# Patient Record
Sex: Female | Born: 2005 | Hispanic: Yes | Marital: Single | Smoking: Never smoker
Health system: Southern US, Community
[De-identification: ages and names within clinical notes are randomized; demographics above are authoritative.]

## PROBLEM LIST (undated history)

## (undated) DIAGNOSIS — E301 Precocious puberty: Secondary | ICD-10-CM

---
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
## 2024-05-03 ENCOUNTER — Encounter (HOSPITAL_COMMUNITY)
Admission: EM | Disposition: A | Discharge status: Home / Self Care | Payer: Self-pay | Source: Home / Self Care | Attending: Emergency Medicine

## 2024-05-03 ENCOUNTER — Other Ambulatory Visit (HOSPITAL_COMMUNITY): Payer: Self-pay

## 2024-05-03 ENCOUNTER — Emergency Department (HOSPITAL_COMMUNITY): Admitting: Anesthesiology

## 2024-05-03 ENCOUNTER — Encounter (HOSPITAL_COMMUNITY): Payer: Self-pay

## 2024-05-03 ENCOUNTER — Other Ambulatory Visit: Payer: Self-pay

## 2024-05-03 ENCOUNTER — Observation Stay (HOSPITAL_COMMUNITY)
Admission: EM | Admit: 2024-05-03 | Discharge: 2024-05-04 | Disposition: A | Discharge status: Home / Self Care | Attending: Surgery | Admitting: Surgery

## 2024-05-03 ENCOUNTER — Emergency Department (HOSPITAL_COMMUNITY)

## 2024-05-03 DIAGNOSIS — R1030 Lower abdominal pain, unspecified: Secondary | ICD-10-CM | POA: Diagnosis present

## 2024-05-03 DIAGNOSIS — K8012 Calculus of gallbladder with acute and chronic cholecystitis without obstruction: Principal | ICD-10-CM | POA: Insufficient documentation

## 2024-05-03 DIAGNOSIS — K810 Acute cholecystitis: Principal | ICD-10-CM | POA: Diagnosis present

## 2024-05-03 LAB — URINALYSIS, MICROSCOPIC (REFLEX)

## 2024-05-03 LAB — URINALYSIS, ROUTINE W REFLEX MICROSCOPIC
Bilirubin Urine: NEGATIVE
Glucose, UA: NEGATIVE mg/dL
Ketones, ur: NEGATIVE mg/dL
Leukocytes,Ua: NEGATIVE
Nitrite: NEGATIVE
Protein, ur: NEGATIVE mg/dL
Specific Gravity, Urine: 1.015 (ref 1.005–1.030)
pH: 5.5 (ref 5.0–8.0)

## 2024-05-03 LAB — CBC
HCT: 42.6 % (ref 36.0–46.0)
Hemoglobin: 14.1 g/dL (ref 12.0–15.0)
MCH: 29.3 pg (ref 26.0–34.0)
MCHC: 33.1 g/dL (ref 30.0–36.0)
MCV: 88.6 fL (ref 80.0–100.0)
Platelets: 298 10*3/uL (ref 150–400)
RBC: 4.81 MIL/uL (ref 3.87–5.11)
RDW: 12.6 % (ref 11.5–15.5)
WBC: 6.3 10*3/uL (ref 4.0–10.5)
nRBC: 0 % (ref 0.0–0.2)

## 2024-05-03 LAB — COMPREHENSIVE METABOLIC PANEL WITH GFR
ALT: 14 U/L (ref 0–44)
AST: 19 U/L (ref 15–41)
Albumin: 4.3 g/dL (ref 3.5–5.0)
Alkaline Phosphatase: 57 U/L (ref 38–126)
Anion gap: 7 (ref 5–15)
BUN: 7 mg/dL (ref 6–20)
CO2: 25 mmol/L (ref 22–32)
Calcium: 9.1 mg/dL (ref 8.9–10.3)
Chloride: 106 mmol/L (ref 98–111)
Creatinine, Ser: 0.7 mg/dL (ref 0.44–1.00)
GFR, Estimated: >60 mL/min (ref >60)
Glucose, Bld: 115 mg/dL — ABNORMAL HIGH (ref 70–99)
Potassium: 4.2 mmol/L (ref 3.5–5.1)
Sodium: 138 mmol/L (ref 135–145)
Total Bilirubin: 0.7 mg/dL (ref 0.0–1.2)
Total Protein: 7.4 g/dL (ref 6.5–8.1)

## 2024-05-03 LAB — LIPASE, BLOOD: Lipase: 39 U/L (ref 11–51)

## 2024-05-03 LAB — HCG, SERUM, QUALITATIVE: Preg, Serum: NEGATIVE

## 2024-05-03 SURGERY — LAPAROSCOPIC CHOLECYSTECTOMY
Anesthesia: General

## 2024-05-03 MED ORDER — HYDROMORPHONE HCL 1 MG/ML IJ SOLN: 0.5000 mg | INTRAMUSCULAR | Status: DC | PRN

## 2024-05-03 MED ORDER — IOHEXOL 350 MG/ML SOLN
60.0000 mL | Freq: Once | INTRAVENOUS | Status: AC | PRN
  Administered 2024-05-03: 60 mL via INTRAVENOUS

## 2024-05-03 MED ORDER — ACETAMINOPHEN 325 MG PO TABS
650.0000 mg | ORAL_TABLET | Freq: Four times a day (QID) | ORAL | Status: DC | PRN
Start: 2024-05-03 — End: 2024-05-04

## 2024-05-03 MED ORDER — LACTATED RINGERS IV SOLN: INTRAVENOUS | Status: DC

## 2024-05-03 MED ORDER — CHLORHEXIDINE GLUCONATE 0.12 % MT SOLN
15.0000 mL | Freq: Once | OROMUCOSAL | Status: AC
  Administered 2024-05-03: 15 mL via OROMUCOSAL

## 2024-05-03 MED ORDER — ENOXAPARIN SODIUM 40 MG/0.4ML IJ SOSY: 40.0000 mg | PREFILLED_SYRINGE | INTRAMUSCULAR | Status: DC

## 2024-05-03 MED ORDER — OXYCODONE HCL 5 MG PO TABS: 5.0000 mg | ORAL_TABLET | ORAL | Status: DC | PRN

## 2024-05-03 MED ORDER — SODIUM CHLORIDE 0.9 % IV SOLN
2.0000 g | INTRAVENOUS | Status: DC
  Administered 2024-05-04: 2 g via INTRAVENOUS
  Filled 2024-05-03: qty 20

## 2024-05-03 MED ORDER — ACETAMINOPHEN 650 MG RE SUPP: 650.0000 mg | Freq: Four times a day (QID) | RECTAL | Status: DC | PRN

## 2024-05-03 MED ORDER — SIMETHICONE 80 MG PO CHEW: 40.0000 mg | CHEWABLE_TABLET | Freq: Four times a day (QID) | ORAL | Status: DC | PRN

## 2024-05-03 MED ORDER — INDOCYANINE GREEN 25 MG IV SOLR
1.2500 mg | Freq: Once | INTRAVENOUS | Status: AC
  Administered 2024-05-03: 1.25 mg via INTRAVENOUS
  Filled 2024-05-03: qty 10

## 2024-05-03 MED ORDER — CEFTRIAXONE SODIUM 1 G IJ SOLR
1.0000 g | Freq: Once | INTRAMUSCULAR | Status: AC
  Administered 2024-05-03: 1 g via INTRAVENOUS
  Filled 2024-05-03: qty 10

## 2024-05-03 MED ORDER — SCOPOLAMINE 1 MG/3DAYS TD PT72
1.0000 | MEDICATED_PATCH | TRANSDERMAL | Status: DC
  Administered 2024-05-03: 1.5 mg via TRANSDERMAL

## 2024-05-03 MED ORDER — ONDANSETRON 4 MG PO TBDP: 4.0000 mg | ORAL_TABLET | Freq: Four times a day (QID) | ORAL | Status: DC | PRN

## 2024-05-03 MED ORDER — ORAL CARE MOUTH RINSE: 15.0000 mL | Freq: Once | OROMUCOSAL | Status: AC

## 2024-05-03 MED ORDER — DIPHENHYDRAMINE HCL 25 MG PO CAPS: 25.0000 mg | ORAL_CAPSULE | Freq: Four times a day (QID) | ORAL | Status: DC | PRN

## 2024-05-03 MED ORDER — ONDANSETRON 4 MG PO TBDP
4.0000 mg | ORAL_TABLET | Freq: Once | ORAL | Status: AC | PRN
  Administered 2024-05-03: 4 mg via ORAL
  Filled 2024-05-03: qty 1

## 2024-05-03 MED ORDER — DIPHENHYDRAMINE HCL 50 MG/ML IJ SOLN: 25.0000 mg | Freq: Four times a day (QID) | INTRAMUSCULAR | Status: DC | PRN

## 2024-05-03 MED ORDER — METOPROLOL TARTRATE 5 MG/5ML IV SOLN: 5.0000 mg | Freq: Four times a day (QID) | INTRAVENOUS | Status: DC | PRN

## 2024-05-03 MED ORDER — LACTATED RINGERS IV SOLN
INTRAVENOUS | Status: AC
Start: 2024-05-03 — End: 2024-05-04

## 2024-05-03 MED ORDER — HYDROMORPHONE HCL 1 MG/ML IJ SOLN
0.5000 mg | INTRAMUSCULAR | Status: DC | PRN
  Administered 2024-05-03: 0.5 mg via INTRAVENOUS
  Filled 2024-05-03: qty 0.5

## 2024-05-03 MED ORDER — SCOPOLAMINE 1 MG/3DAYS TD PT72
MEDICATED_PATCH | TRANSDERMAL | Status: AC
  Filled 2024-05-03: qty 1

## 2024-05-03 NOTE — ED Provider Notes (Signed)
  EMERGENCY DEPARTMENT AT [facility] Provider Note   CSN: [id number] Arrival date & time: 05/03/24  0459     History  Chief Complaint  Patient presents with   Abdominal Pain    Maureen Wolf is a 18 y.o. female.  Patient presents to the emergency department for evaluation of abdominal pain.  Patient reports that she has frequent intermittent abdominal pain secondary to chronic constipation.  She started having pain around 2 AM.  Pain is in the center of the abdomen.  Patient reports a large bowel movement at 3 AM but did not have resolution of her pain.       Home Medications Prior to Admission medications   Not on File      Allergies    Patient has no known allergies.    Review of Systems   Review of Systems  Physical Exam Updated Vital Signs BP 133/82 (BP Location: Right Arm)   Pulse 81   Temp 98.3 F (36.8 C)   Resp 18   Ht 5\' 3"  (1.6 m)   Wt 62.1 kg   LMP 05/03/2024   SpO2 100%   BMI 24.27 kg/m  Physical Exam Vitals and nursing note reviewed.  Constitutional:      General: She is not in acute distress.    Appearance: She is well-developed.  HENT:     Head: Normocephalic and atraumatic.     Mouth/Throat:     Mouth: Mucous membranes are moist.  Eyes:     General: Vision grossly intact. Gaze aligned appropriately.     Extraocular Movements: Extraocular movements intact.     Conjunctiva/sclera: Conjunctivae normal.  Cardiovascular:     Rate and Rhythm: Normal rate and regular rhythm.     Pulses: Normal pulses.     Heart sounds: Normal heart sounds, S1 normal and S2 normal. No murmur heard.    No friction rub. No gallop.  Pulmonary:     Effort: Pulmonary effort is normal. No respiratory distress.     Breath sounds: Normal breath sounds.  Abdominal:     General: Bowel sounds are normal.     Palpations: Abdomen is soft.     Tenderness: There is abdominal tenderness in the periumbilical area and suprapubic area. There is  no guarding or rebound.     Hernia: No hernia is present.  Musculoskeletal:        General: No swelling.     Cervical back: Full passive range of motion without pain, normal range of motion and neck supple. No spinous process tenderness or muscular tenderness. Normal range of motion.     Right lower leg: No edema.     Left lower leg: No edema.  Skin:    General: Skin is warm and dry.     Capillary Refill: Capillary refill takes less than 2 seconds.     Findings: No ecchymosis, erythema, rash or wound.  Neurological:     General: No focal deficit present.     Mental Status: She is alert and oriented to person, place, and time.     GCS: GCS eye subscore is 4. GCS verbal subscore is 5. GCS motor subscore is 6.     Cranial Nerves: Cranial nerves 2-12 are intact.     Sensory: Sensation is intact.     Motor: Motor function is intact.     Coordination: Coordination is intact.  Psychiatric:        Attention and Perception: Attention normal.  Mood and Affect: Mood normal.        Speech: Speech normal.        Behavior: Behavior normal.     ED Results / Procedures / Treatments   Labs (all labs ordered are listed, but only abnormal results are displayed) Labs Reviewed  COMPREHENSIVE METABOLIC PANEL WITH GFR - Abnormal; Notable for the following components:      Result Value   Glucose, Bld 115 (*)    All other components within normal limits  LIPASE, BLOOD  CBC  HCG, SERUM, QUALITATIVE  URINALYSIS, ROUTINE W REFLEX MICROSCOPIC    EKG None  Radiology No results found.  Procedures Procedures    Medications Ordered in ED Medications  ondansetron  (ZOFRAN -ODT) disintegrating tablet 4 mg (4 mg Oral Given 05/03/24 0511)    ED Course/ Medical Decision Making/ A&P                                 Medical Decision Making Amount and/or Complexity of Data Reviewed Labs: ordered. Radiology: ordered.  Risk Prescription drug management.   Presents to the emergency  department for evaluation of lower abdominal pain.  Patient reports frequent pains similar to this associated with constipation.  Pain started at 2 AM but was not relieved by a bowel movement, as it normally is.  Examination reveals diffuse middle abdominal tenderness, no guarding or rebound.  Lab work unremarkable.  Will perform CT scan to further evaluate.  Will sign out to oncoming ER physician to follow CT results.        Final Clinical Impression(s) / ED Diagnoses Final diagnoses:  Lower abdominal pain    Rx / DC Orders ED Discharge Orders     None         Ballard Bongo, MD 05/03/24 [phone]

## 2024-05-03 NOTE — ED Provider Notes (Signed)
 Blood pressure 133/82, pulse 81, temperature 98.3 F (36.8 C), resp. rate 18, height 5' 3 (1.6 m), weight 62.1 kg, last menstrual period 05/03/2024, SpO2 100%.  Assuming care from Dr. Carol Chroman.  In short, Maureen Wolf is a 18 y.o. female with a chief complaint of Abdominal Pain .  Refer to the original H&P for additional details.  The current plan of care is to follow up on CT.  07:53 AM  CT with concern for acute cholecystitis. Spoke with radiology regarding scan. On my exam patient without significant tenderness in the RUQ. Labs reassuring. Will discuss with general surgery. Plan for admit.     Roberts Ching, MD 05/18/24 0000

## 2024-05-03 NOTE — ED Triage Notes (Signed)
 Pt POV d/t ABD pain onset 0200.  Pt knows she normally has pain from constipation but had a large BM at 0300.  Normally pain goes away but this time did not.  Pt did vomit a little but also normal with the constipation.

## 2024-05-03 NOTE — ED Notes (Signed)
 PA notified of patient's wishes to have gallbladder removed

## 2024-05-03 NOTE — H&P (Signed)
 Maureen Wolf 01-Oct-2006  [id number].    Chief Complaint/Reason for Consult: abdominal pain, cholecystitis  HPI:  This is a pleasant otherwise healthy 18 yo female who does vape everyday, but has been having some abdominal pain with eating in the past.  She states she was eating twice and day and has dropped to once a day as eating can make her stomach hurt from time to time.  She hasn't noticed any one specific type of food.  She has been awoken from sleep a couple times with pain that tends to go away.  Last night she awoke around 0200 with pain in her epigastrium.  She then developed nausea and one episode of vomiting.  She denies fevers or diarrhea.  She has had no further vomiting, but mild nausea.    She presented to the ED for evaluation.  Her pain seems to have subsided at this time.  Labs are unremarkable, but she has a CT scan concerning for cholecystitis as well as an US  with gb wall of 7.41mm and large gallstones.  We have been asked to see her for further evaluation and recommendations.  ROS: ROS: see HPI  History reviewed. No pertinent family history.  History reviewed. No pertinent past medical history.  Past Surgical History:  Procedure Laterality Date   SUPPRELIN  IMPLANT Left 08/03/2013   Procedure: SUPPRELIN  IMPLANT;  Surgeon: Melven Stable. Alanda Allegra, MD;  Location: [facility];  Service: Pediatrics;  Laterality: Left;   SUPPRELIN  IMPLANT Left 09/27/2014   Procedure: SUPPRELIN  REINSERT;  Surgeon: Melven Stable. Alanda Allegra, MD;  Location: [facility];  Service: Pediatrics;  Laterality: Left;   SUPPRELIN  REMOVAL Left 09/27/2014   Procedure: SUPPRELIN  REMOVAL;  Surgeon: Melven Stable. Alanda Allegra, MD;  Location: [facility];  Service: Pediatrics;  Laterality: Left;   SUPPRELIN  REMOVAL Left 07/30/2016   Procedure: EXCISION OF RETAINED SUPPRELIN  IMPLANT FROM LEFT UPPER ARM;  Surgeon: Alanda Allegra, MD;  Location: [facility];   Service: Pediatrics;  Laterality: Left;    Social History:  reports that she has never smoked. She has never used smokeless tobacco. She reports that she does not drink alcohol and does not use drugs.  Allergies: No Known Allergies  (Not in a hospital admission)    Physical Exam: Blood pressure 126/88, pulse (!) 55, temperature 99.1 F (37.3 C), temperature source Oral, resp. rate 16, height 5\' 3"  (1.6 m), weight 62.1 kg, last menstrual period 05/03/2024, SpO2 100%. General: pleasant, WD, WN female who is laying in bed in NAD HEENT: head is normocephalic, atraumatic.  Sclera are noninjected.  PERRL.  Ears and nose without any masses or lesions.  Mouth is pink and moist Heart: regular, rate, and rhythm.  Normal s1,s2. No obvious murmurs, gallops, or rubs noted.  Lungs: CTAB, no wheezes, rhonchi, or rales noted.  Respiratory effort nonlabored Abd: soft, NT, ND, +BS, no masses, hernias, or organomegaly MS: all 4 extremities are symmetrical with no cyanosis, clubbing, or edema. Psych: A&Ox3 with an appropriate affect.   Results for orders placed or performed during the hospital encounter of 05/03/24 (from the past 48 hours)  Lipase, blood     Status: None   Collection Time: 05/03/24  5:10 AM  Result Value Ref Range   Lipase 39 11 - 51 U/L    Comment: Performed at [facility], [address], [place], [place] [address]  Comprehensive metabolic panel     Status: Abnormal   Collection Time:  05/03/24  5:10 AM  Result Value Ref Range   Sodium 138 135 - 145 mmol/L   Potassium 4.2 3.5 - 5.1 mmol/L   Chloride 106 98 - 111 mmol/L   CO2 25 22 - 32 mmol/L   Glucose, Bld 115 (H) 70 - 99 mg/dL    Comment: Glucose reference range applies only to samples taken after fasting for at least 8 hours.   BUN 7 6 - 20 mg/dL   Creatinine, Ser 7.84 0.44 - 1.00 mg/dL   Calcium 9.1 8.9 - 69.6 mg/dL   Total Protein 7.4 6.5 - 8.1 g/dL   Albumin 4.3 3.5 - 5.0 g/dL   AST 19 15 - 41 U/L   ALT 14 0 -  44 U/L   Alkaline Phosphatase 57 38 - 126 U/L   Total Bilirubin 0.7 0.0 - 1.2 mg/dL   GFR, Estimated >29 >52 mL/min    Comment: (NOTE) Calculated using the CKD-EPI Creatinine Equation (2021)    Anion gap 7 5 - 15    Comment: Performed at [facility], [address] [place], [place] [address]  CBC     Status: None   Collection Time: 05/03/24  5:10 AM  Result Value Ref Range   WBC 6.3 4.0 - 10.5 K/uL   RBC 4.81 3.87 - 5.11 MIL/uL   Hemoglobin 14.1 12.0 - 15.0 g/dL   HCT 44.0 10.2 - 72.5 %   MCV 88.6 80.0 - 100.0 fL   MCH 29.3 26.0 - 34.0 pg   MCHC 33.1 30.0 - 36.0 g/dL   RDW 36.6 44.0 - 34.7 %   Platelets 298 150 - 400 K/uL   nRBC 0.0 0.0 - 0.2 %    Comment: Performed at [facility], [address], [place], [place] [address]  hCG, serum, qualitative     Status: None   Collection Time: 05/03/24  5:10 AM  Result Value Ref Range   Preg, Serum NEGATIVE NEGATIVE    Comment:        THE SENSITIVITY OF THIS METHODOLOGY IS >10 mIU/mL. Performed at [facility], [address], [place], [place] [address]   Urinalysis, Routine w reflex microscopic -Urine, Clean Catch     Status: Abnormal   Collection Time: 05/03/24  5:15 AM  Result Value Ref Range   Color, Urine YELLOW YELLOW   APPearance CLEAR CLEAR   Specific Gravity, Urine 1.015 1.005 - 1.030   pH 5.5 5.0 - 8.0   Glucose, UA NEGATIVE NEGATIVE mg/dL   Hgb urine dipstick LARGE (A) NEGATIVE   Bilirubin Urine NEGATIVE NEGATIVE   Ketones, ur NEGATIVE NEGATIVE mg/dL   Protein, ur NEGATIVE NEGATIVE mg/dL   Nitrite NEGATIVE NEGATIVE   Leukocytes,Ua NEGATIVE NEGATIVE    Comment: Performed at [facility], [address] [place], [place] [address]  Urinalysis, Microscopic (reflex)     Status: Abnormal   Collection Time: 05/03/24  5:15 AM  Result Value Ref Range   RBC / HPF 6-10 0 - 5 RBC/hpf   WBC, UA 0-5 0 - 5 WBC/hpf   Bacteria, UA MANY (A) NONE SEEN   Squamous Epithelial / HPF 0-5 0 - 5 /HPF   Mucus  PRESENT     Comment: Performed at [facility], [address], [place], [place] [address]   CT ABDOMEN PELVIS W CONTRAST Result Date: 05/03/2024 CLINICAL DATA:  18 year old with abdominal pain. EXAM: CT ABDOMEN AND PELVIS WITH CONTRAST TECHNIQUE: Multidetector CT  imaging of the abdomen and pelvis was performed using the standard protocol following bolus administration of intravenous contrast. RADIATION DOSE REDUCTION: This exam was performed according to the departmental dose-optimization program which includes automated exposure control, adjustment of the mA and/or kV according to patient size and/or use of iterative reconstruction technique. CONTRAST:  60mL OMNIPAQUE IOHEXOL 350 MG/ML SOLN COMPARISON:  Ultrasound complete abdomen study 12/03/2016 FINDINGS: Lower chest: No abnormality. Hepatobiliary: The liver parenchyma is unremarkable. There is abnormal dilatation of the gallbladder up to 12 cm length, pericholecystic fluid, and mild thickening in the distal free wall. Best seen on the coronal reformatting, there are several dependently layering rounded noncalcified structures which are almost certainly noncalcified gallstones. The largest of these is 1.5 cm. No biliary ductal dilatation or ductal stones are seen. Pancreas: No abnormality. Spleen: No abnormality. Adrenals/Urinary Tract: No abnormality. Stomach/Bowel: No dilatation or wall thickening. There is a short but otherwise normal appendix. Vascular/Lymphatic: No significant vascular findings are present. No enlarged abdominal or pelvic lymph nodes. Reproductive: Uterus and bilateral adnexa are unremarkable. Other: No pelvic ascites. No free hemorrhage, free air or abscess. There are no incarcerated hernias. Musculoskeletal: No acute or significant osseous findings. IMPRESSION: 1. Findings are consistent with acute cholecystitis, with several noncalcified gallstones. No biliary ductal dilatation or ductal stones are seen. 2. No other acute CT  findings in the abdomen or pelvis. 3. Critical Value/emergent results were called by telephone at the time of interpretation on 05/03/2024 at 7:48 am to provider DR. LONG, who verbally acknowledged these results. Electronically Signed   By: Denman Fischer M.D.   On: 05/03/2024 07:54      Assessment/Plan Cholecystitis, calculous The patient has been seen, examined, labs, vitals, chart, and imaging personally reviewed.  She certainly has radiographic evidence concerning for cholecystitis; however, she is completely nontender on exam and her labs and vitals are all normal.  I discussed the etiology of cholecystitis with the patient and that I'm concerned that even without current symptoms her symptoms/pain will return if we don't proceed with lap chole.  I have explained the procedure, risks, and aftercare of cholecystectomy.  Risks include but are not limited to bleeding, infection, wound problems, anesthesia, diarrhea, bile leak, injury to common bile duct/liver/intestine.  She seems to understand.  She would like to discuss this with her father who is at the bedside and let us  know if she wants to proceed with surgery vs go home and follow up as an outpatient.  If she would like to proceed we can try to proceed today pending OR availability.   FEN - NPO VTE - SCDs in OR if stays ID - Rocephin if stays Admit - obs, OR, if stays  I reviewed ED provider notes, last 24 h vitals and pain scores, last 48 h intake and output, last 24 h labs and trends, and last 24 h imaging results.  Leone Ralphs, [facility] 05/03/2024, 11:27 AM Please see Amion for pager number during day hours 7:00am-4:30pm or 7:00am -11:30am on weekends

## 2024-05-03 NOTE — Anesthesia Preprocedure Evaluation (Signed)
 Anesthesia Evaluation  Patient identified by MRN, date of birth, ID band Patient awake    Reviewed: Allergy & Precautions, NPO status , Patient's Chart, lab work & pertinent test results  Airway Mallampati: II  TM Distance: >3 FB Neck ROM: Full    Dental no notable dental hx. (+) Teeth Intact, Dental Advisory Given   Pulmonary neg pulmonary ROS   Pulmonary exam normal breath sounds clear to auscultation       Cardiovascular negative cardio ROS Normal cardiovascular exam Rhythm:Regular Rate:Normal     Neuro/Psych negative neurological ROS  negative psych ROS   GI/Hepatic Neg liver ROS,,,Cholelithiasis with acute cholecystitis   Endo/Other  negative endocrine ROS    Renal/GU negative Renal ROS  negative genitourinary   Musculoskeletal negative musculoskeletal ROS (+)    Abdominal  (+)  Abdomen: tender.   Peds  Hematology negative hematology ROS (+)   Anesthesia Other Findings   Reproductive/Obstetrics                              Anesthesia Physical Anesthesia Plan  ASA: 2  Anesthesia Plan: General   Post-op Pain Management: Minimal or no pain anticipated   Induction: Intravenous  PONV Risk Score and Plan: 4 or greater and Treatment may vary due to age or medical condition, Midazolam , Ondansetron , Dexamethasone  and Scopolamine patch - Pre-op  Airway Management Planned: Oral ETT  Additional Equipment: None  Intra-op Plan:   Post-operative Plan: Extubation in OR  Informed Consent: I have reviewed the patients History and Physical, chart, labs and discussed the procedure including the risks, benefits and alternatives for the proposed anesthesia with the patient or authorized representative who has indicated his/her understanding and acceptance.     Dental advisory given  Plan Discussed with: CRNA and Anesthesiologist  Anesthesia Plan Comments:          Anesthesia  Quick Evaluation

## 2024-05-03 NOTE — Progress Notes (Signed)
 Report given to Devra Fontana, RN 6N-07. Pt AAOx4, and in no apparent distress or pain. Family at the bedside. Awaiting transport to the floor.

## 2024-05-03 NOTE — Plan of Care (Signed)
  Problem: Education: Goal: Knowledge of General Education information will improve Description: Including pain rating scale, medication(s)/side effects and non-pharmacologic comfort measures 05/03/2024 1926 by Loran Rock, RN Outcome: Progressing 05/03/2024 1926 by Loran Rock, RN Outcome: Progressing   Problem: Health Behavior/Discharge Planning: Goal: Ability to manage health-related needs will improve 05/03/2024 1926 by Loran Rock, RN Outcome: Progressing 05/03/2024 1926 by Loran Rock, RN Outcome: Progressing   Problem: Clinical Measurements: Goal: Ability to maintain clinical measurements within normal limits will improve 05/03/2024 1926 by Loran Rock, RN Outcome: Progressing 05/03/2024 1926 by Loran Rock, RN Outcome: Progressing Goal: Will remain free from infection 05/03/2024 1926 by Loran Rock, RN Outcome: Progressing 05/03/2024 1926 by Loran Rock, RN Outcome: Progressing Goal: Diagnostic test results will improve 05/03/2024 1926 by Loran Rock, RN Outcome: Progressing 05/03/2024 1926 by Loran Rock, RN Outcome: Progressing Goal: Respiratory complications will improve 05/03/2024 1926 by Loran Rock, RN Outcome: Progressing 05/03/2024 1926 by Loran Rock, RN Outcome: Progressing Goal: Cardiovascular complication will be avoided 05/03/2024 1926 by Loran Rock, RN Outcome: Progressing 05/03/2024 1926 by Loran Rock, RN Outcome: Progressing   Problem: Activity: Goal: Risk for activity intolerance will decrease 05/03/2024 1926 by Loran Rock, RN Outcome: Progressing 05/03/2024 1926 by Loran Rock, RN Outcome: Progressing   Problem: Nutrition: Goal: Adequate nutrition will be maintained 05/03/2024 1926 by Loran Rock, RN Outcome: Progressing 05/03/2024 1926 by Loran Rock, RN Outcome: Progressing   Problem:  Coping: Goal: Level of anxiety will decrease 05/03/2024 1926 by Loran Rock, RN Outcome: Progressing 05/03/2024 1926 by Loran Rock, RN Outcome: Progressing   Problem: Elimination: Goal: Will not experience complications related to bowel motility 05/03/2024 1926 by Loran Rock, RN Outcome: Progressing 05/03/2024 1926 by Loran Rock, RN Outcome: Progressing Goal: Will not experience complications related to urinary retention 05/03/2024 1926 by Loran Rock, RN Outcome: Progressing 05/03/2024 1926 by Loran Rock, RN Outcome: Progressing   Problem: Pain Managment: Goal: General experience of comfort will improve and/or be controlled 05/03/2024 1926 by Loran Rock, RN Outcome: Progressing 05/03/2024 1926 by Loran Rock, RN Outcome: Progressing   Problem: Safety: Goal: Ability to remain free from injury will improve 05/03/2024 1926 by Loran Rock, RN Outcome: Progressing 05/03/2024 1926 by Loran Rock, RN Outcome: Progressing   Problem: Skin Integrity: Goal: Risk for impaired skin integrity will decrease 05/03/2024 1926 by Loran Rock, RN Outcome: Progressing 05/03/2024 1926 by Loran Rock, RN Outcome: Progressing

## 2024-05-03 NOTE — Plan of Care (Signed)

## 2024-05-03 NOTE — ED Notes (Signed)
 Informed consent signed by patient and witnessed by this RN. Paper copy at bedside.

## 2024-05-03 NOTE — Discharge Instructions (Signed)
CIRUGIA LAPAROSCOPICA: INSTRUCCIONES DE POST OPERATORIO.  Revise siempre los documentos que le entreguen en el lugar donde se ha hecho la cirugia.  SI USTED NECESITA DOCUMENTOS DE INCAPACIDAD (DISABLE) O DE PERMISO FAMILAR (FAMILY LEAVE) NECESITA TRAERLOS A LA OFICINA PARA QUE SEAN PROCESADOS. NO  SE LOS DE A SU DOCTOR. A su alta del hospital se le dara una receta para controlar el dolor. Tomela como ha sido recetada, si la necesita. Si no la necesita puede tomar, Acetaminofen (Tylenol) o Ibuprofen (Advil) para aliviar dolor moderado. Continue tomando el resto de sus medicinas. Si necesita rellenar la receta, llame a la farmacia. ellos contactan a nuestra oficina pidiendo autorizacion. Este tipo de receta no pueden ser rellenadas despues de las  5pm o durante los fines de semana. Con relacion a la dieta: debe ser ligera los primeros dias despues que llege a la casa. Ejemplo: sopas y galleticas. Tome bastante liquido esos dias. La mayoria de los pacientes padecen de inflamacion y cambio de coloracion de la piel alrededor de las incisiones. esto toma dias en resolver.  pnerse una bolsa de hielo en el area affectada ayuda..  Es comun tambien tener un poco de estrenimiento si esta tomado medicinas para el dolor. incremente la cantidad de liquidos a tomar y puede tomar (Colace) esto previene el problema. Si ya tiene estrenimiento, es decir no ha defecado en 48 horas, puede tomar un laxativo (Milk of Magnesia or Miralax) uselo como el paquete le explica.  A menos que se le diga algo diferente. Remueva el bendaje a las 24-48 horas despues dela cirugia. y puede banarse en la ducha sin ningun problema. usted puede tener steri-strips (pequenas curitas transparentes en la piel puesta encima de la incision)  Estas banditas strips should be left on the skin for 7-10 days.   Si su cirujano puso pegamento encima de la incision usted puede banarse bajo la ducha en 24 horas. Este pegamento empezara a caerse en las  proximas 2-3 semanas. Si le pusieron suturas o presillas (grapos) estos seran quitados en su proxima cita en la oficina. . ACTIVIDADES:  Puede hacer actividad ligera.  Como caminar , subir escaleras y poco a poco irlas incrementando tanto como las tolere. Puede tener relaciones sexuales cuando sea comfortable. No carge objetos pesados o haga esfuerzos que no sean aprovados por su doctor. Puede manejar en cuanto no esta tomando medicamentos fuertes (narcoticos) para el dolor, pueda abrochar confortablemente el cinturon de seguridad, y pueda maniobrar y usar los pedales de su vehiculo con seguridad. PUEDE REGRESAR A TRABAJAR  Debe ver a su doctor para una cita de seguimiento en 2-3 semanas despues de la cirugia.   CUANDO LLAMAR A SU MEDICO: FIEBRE mayor de  101.0 No produccion de orina. Sangramiento continue de la herida Incremento de dolor, enrojecimientio o drenaje de la herida (incision) Incremento de dolor abdominal.  The clinic staff is available to answer your questions during regular business hours.  Please don't hesitate to call and ask to speak to one of the nurses for clinical concerns.  If you have a medical emergency, go to the nearest emergency room or call 911.  A surgeon from [facility] is always on call at the hospital. [address], [address], [place], [place]  [address] ? [address] [address], [place], [place]   [address] [phone] ? [phone] ? FAX [phone] Web site: [email or web address]  

## 2024-05-03 NOTE — ED Notes (Signed)
 Patient transported to CT

## 2024-05-03 NOTE — ED Notes (Signed)
 PA at bedside.

## 2024-05-04 ENCOUNTER — Other Ambulatory Visit: Payer: Self-pay

## 2024-05-04 ENCOUNTER — Encounter (HOSPITAL_COMMUNITY): Payer: Self-pay

## 2024-05-04 ENCOUNTER — Observation Stay (HOSPITAL_BASED_OUTPATIENT_CLINIC_OR_DEPARTMENT_OTHER): Payer: Self-pay | Admitting: Anesthesiology

## 2024-05-04 ENCOUNTER — Encounter (HOSPITAL_COMMUNITY)
Admission: EM | Disposition: A | Discharge status: Home / Self Care | Payer: Self-pay | Source: Home / Self Care | Attending: Emergency Medicine

## 2024-05-04 ENCOUNTER — Observation Stay (HOSPITAL_COMMUNITY): Payer: Self-pay | Admitting: Anesthesiology

## 2024-05-04 DIAGNOSIS — K8000 Calculus of gallbladder with acute cholecystitis without obstruction: Secondary | ICD-10-CM

## 2024-05-04 HISTORY — PX: CHOLECYSTECTOMY: SHX55

## 2024-05-04 HISTORY — PX: INDOCYANINE GREEN FLUORESCENCE IMAGING (ICG): SHX7595

## 2024-05-04 LAB — COMPREHENSIVE METABOLIC PANEL WITH GFR
ALT: 10 U/L (ref 0–44)
AST: 14 U/L — ABNORMAL LOW (ref 15–41)
Albumin: 3.2 g/dL — ABNORMAL LOW (ref 3.5–5.0)
Alkaline Phosphatase: 42 U/L (ref 38–126)
Anion gap: 4 — ABNORMAL LOW (ref 5–15)
BUN: 9 mg/dL (ref 6–20)
CO2: 25 mmol/L (ref 22–32)
Calcium: 8.6 mg/dL — ABNORMAL LOW (ref 8.9–10.3)
Chloride: 108 mmol/L (ref 98–111)
Creatinine, Ser: 0.66 mg/dL (ref 0.44–1.00)
GFR, Estimated: >60 mL/min (ref >60)
Glucose, Bld: 86 mg/dL (ref 70–99)
Potassium: 3.6 mmol/L (ref 3.5–5.1)
Sodium: 137 mmol/L (ref 135–145)
Total Bilirubin: 0.6 mg/dL (ref 0.0–1.2)
Total Protein: 5.5 g/dL — ABNORMAL LOW (ref 6.5–8.1)

## 2024-05-04 LAB — CBC
HCT: 34.6 % — ABNORMAL LOW (ref 36.0–46.0)
Hemoglobin: 11.7 g/dL — ABNORMAL LOW (ref 12.0–15.0)
MCH: 29.5 pg (ref 26.0–34.0)
MCHC: 33.8 g/dL (ref 30.0–36.0)
MCV: 87.2 fL (ref 80.0–100.0)
Platelets: 231 10*3/uL (ref 150–400)
RBC: 3.97 MIL/uL (ref 3.87–5.11)
RDW: 12.6 % (ref 11.5–15.5)
WBC: 5.2 10*3/uL (ref 4.0–10.5)
nRBC: 0 % (ref 0.0–0.2)

## 2024-05-04 LAB — SURGICAL PCR SCREEN
MRSA, PCR: NEGATIVE
Staphylococcus aureus: NEGATIVE

## 2024-05-04 SURGERY — LAPAROSCOPIC CHOLECYSTECTOMY
Anesthesia: General

## 2024-05-04 MED ORDER — PROPOFOL 10 MG/ML IV BOLUS
INTRAVENOUS | Status: DC | PRN
  Administered 2024-05-04: 160 mg via INTRAVENOUS

## 2024-05-04 MED ORDER — ONDANSETRON HCL 4 MG/2ML IJ SOLN
4.0000 mg | Freq: Once | INTRAMUSCULAR | Status: DC | PRN
Start: 2024-05-04 — End: 2024-05-04

## 2024-05-04 MED ORDER — ACETAMINOPHEN 500 MG PO TABS
1000.0000 mg | ORAL_TABLET | Freq: Four times a day (QID) | ORAL | Status: AC | PRN
Start: 2024-05-04 — End: ?

## 2024-05-04 MED ORDER — BUPIVACAINE-EPINEPHRINE 0.25% -1:200000 IJ SOLN
INTRAMUSCULAR | Status: DC | PRN
  Administered 2024-05-04: 22 mL

## 2024-05-04 MED ORDER — OXYCODONE HCL 5 MG PO TABS: 5.0000 mg | ORAL_TABLET | Freq: Once | ORAL | Status: DC | PRN

## 2024-05-04 MED ORDER — TRAMADOL HCL 50 MG PO TABS: 50.0000 mg | ORAL_TABLET | Freq: Four times a day (QID) | ORAL | Status: DC | PRN

## 2024-05-04 MED ORDER — HYDROMORPHONE HCL 1 MG/ML IJ SOLN
0.2500 mg | INTRAMUSCULAR | Status: DC | PRN
  Administered 2024-05-04: 0.5 mg via INTRAVENOUS

## 2024-05-04 MED ORDER — SUGAMMADEX SODIUM 200 MG/2ML IV SOLN
INTRAVENOUS | Status: DC | PRN
  Administered 2024-05-04: 124.2 mg via INTRAVENOUS

## 2024-05-04 MED ORDER — SODIUM CHLORIDE 0.9 % IR SOLN
Status: DC | PRN
Start: 2024-05-04 — End: 2024-05-04
  Administered 2024-05-04: 1

## 2024-05-04 MED ORDER — ORAL CARE MOUTH RINSE: 15.0000 mL | Freq: Once | OROMUCOSAL | Status: AC

## 2024-05-04 MED ORDER — ROCURONIUM BROMIDE 10 MG/ML (PF) SYRINGE
PREFILLED_SYRINGE | INTRAVENOUS | Status: AC
Start: 2024-05-04 — End: ?
  Filled 2024-05-04: qty 10

## 2024-05-04 MED ORDER — DROPERIDOL 2.5 MG/ML IJ SOLN: 0.6250 mg | Freq: Once | INTRAMUSCULAR | Status: DC | PRN

## 2024-05-04 MED ORDER — INDOCYANINE GREEN 25 MG IV SOLR
2.5000 mg | Freq: Once | INTRAVENOUS | Status: AC
  Administered 2024-05-04: 2.5 mg via INTRAVENOUS
  Filled 2024-05-04: qty 10

## 2024-05-04 MED ORDER — LACTATED RINGERS IV SOLN: INTRAVENOUS | Status: DC

## 2024-05-04 MED ORDER — HYDROMORPHONE HCL 1 MG/ML IJ SOLN
INTRAMUSCULAR | Status: AC
Start: 2024-05-04 — End: ?
  Filled 2024-05-04: qty 1

## 2024-05-04 MED ORDER — OXYCODONE HCL 5 MG/5ML PO SOLN: 5.0000 mg | Freq: Once | ORAL | Status: DC | PRN

## 2024-05-04 MED ORDER — TRAMADOL HCL 50 MG PO TABS: 50.0000 mg | ORAL_TABLET | Freq: Four times a day (QID) | ORAL | 0 refills | Status: AC | PRN

## 2024-05-04 MED ORDER — ROCURONIUM BROMIDE 10 MG/ML (PF) SYRINGE
PREFILLED_SYRINGE | INTRAVENOUS | Status: DC | PRN
  Administered 2024-05-04: 70 mg via INTRAVENOUS

## 2024-05-04 MED ORDER — ACETAMINOPHEN 500 MG PO TABS
1000.0000 mg | ORAL_TABLET | Freq: Four times a day (QID) | ORAL | Status: DC
  Administered 2024-05-04: 1000 mg via ORAL
  Filled 2024-05-04: qty 2

## 2024-05-04 MED ORDER — LIDOCAINE 2% (20 MG/ML) 5 ML SYRINGE
INTRAMUSCULAR | Status: AC
  Filled 2024-05-04: qty 5

## 2024-05-04 MED ORDER — LIDOCAINE 2% (20 MG/ML) 5 ML SYRINGE
INTRAMUSCULAR | Status: DC | PRN
  Administered 2024-05-04: 60 mg via INTRAVENOUS

## 2024-05-04 MED ORDER — PROPOFOL 10 MG/ML IV BOLUS
INTRAVENOUS | Status: AC
  Filled 2024-05-04: qty 20

## 2024-05-04 MED ORDER — DEXAMETHASONE SODIUM PHOSPHATE 10 MG/ML IJ SOLN
INTRAMUSCULAR | Status: AC
  Filled 2024-05-04: qty 1

## 2024-05-04 MED ORDER — FENTANYL CITRATE (PF) 250 MCG/5ML IJ SOLN
INTRAMUSCULAR | Status: AC
  Filled 2024-05-04: qty 5

## 2024-05-04 MED ORDER — DEXMEDETOMIDINE HCL IN NACL 80 MCG/20ML IV SOLN
INTRAVENOUS | Status: AC
  Filled 2024-05-04: qty 20

## 2024-05-04 MED ORDER — PHENYLEPHRINE 80 MCG/ML (10ML) SYRINGE FOR IV PUSH (FOR BLOOD PRESSURE SUPPORT)
PREFILLED_SYRINGE | INTRAVENOUS | Status: DC | PRN
  Administered 2024-05-04: 80 ug via INTRAVENOUS

## 2024-05-04 MED ORDER — DEXAMETHASONE SODIUM PHOSPHATE 10 MG/ML IJ SOLN
INTRAMUSCULAR | Status: DC | PRN
  Administered 2024-05-04: 10 mg via INTRAVENOUS

## 2024-05-04 MED ORDER — IBUPROFEN 600 MG PO TABS: 600.0000 mg | ORAL_TABLET | Freq: Three times a day (TID) | ORAL | Status: AC | PRN

## 2024-05-04 MED ORDER — BUPIVACAINE-EPINEPHRINE (PF) 0.25% -1:200000 IJ SOLN
INTRAMUSCULAR | Status: AC
  Filled 2024-05-04: qty 30

## 2024-05-04 MED ORDER — MIDAZOLAM HCL 2 MG/2ML IJ SOLN
INTRAMUSCULAR | Status: AC
  Filled 2024-05-04: qty 2

## 2024-05-04 MED ORDER — CHLORHEXIDINE GLUCONATE 0.12 % MT SOLN
15.0000 mL | Freq: Once | OROMUCOSAL | Status: AC
  Administered 2024-05-04: 15 mL via OROMUCOSAL

## 2024-05-04 MED ORDER — ONDANSETRON HCL 4 MG/2ML IJ SOLN
INTRAMUSCULAR | Status: DC | PRN
  Administered 2024-05-04: 4 mg via INTRAVENOUS

## 2024-05-04 MED ORDER — FENTANYL CITRATE (PF) 250 MCG/5ML IJ SOLN
INTRAMUSCULAR | Status: DC | PRN
  Administered 2024-05-04: 150 ug via INTRAVENOUS
  Administered 2024-05-04: 50 ug via INTRAVENOUS

## 2024-05-04 MED ORDER — ONDANSETRON HCL 4 MG/2ML IJ SOLN
INTRAMUSCULAR | Status: AC
Start: 2024-05-04 — End: ?
  Filled 2024-05-04: qty 2

## 2024-05-04 MED ORDER — MIDAZOLAM HCL 2 MG/2ML IJ SOLN
INTRAMUSCULAR | Status: DC | PRN
  Administered 2024-05-04: 2 mg via INTRAVENOUS

## 2024-05-04 MED ORDER — IBUPROFEN 600 MG PO TABS: 600.0000 mg | ORAL_TABLET | Freq: Three times a day (TID) | ORAL | Status: DC

## 2024-05-04 SURGICAL SUPPLY — 31 items
BAG COUNTER SPONGE SURGICOUNT (BAG) ×1 IMPLANT
BLADE CLIPPER SURG (BLADE) IMPLANT
CANISTER SUCTION 3000ML PPV (SUCTIONS) ×1 IMPLANT
CHLORAPREP W/TINT 26 (MISCELLANEOUS) ×1 IMPLANT
CLIP APPLIE 5 13 M/L LIGAMAX5 (MISCELLANEOUS) ×1 IMPLANT
COVER SURGICAL LIGHT HANDLE (MISCELLANEOUS) ×1 IMPLANT
DERMABOND ADVANCED .7 DNX12 (GAUZE/BANDAGES/DRESSINGS) ×1 IMPLANT
DISSECTOR BLUNT TIP ENDO 5MM (MISCELLANEOUS) IMPLANT
ELECTRODE REM PT RTRN 9FT ADLT (ELECTROSURGICAL) ×1 IMPLANT
GLOVE BIO SURGEON STRL SZ7.5 (GLOVE) ×1 IMPLANT
GLOVE INDICATOR 8.0 STRL GRN (GLOVE) ×1 IMPLANT
GOWN STRL REUS W/ TWL LRG LVL3 (GOWN DISPOSABLE) ×2 IMPLANT
GOWN STRL REUS W/ TWL XL LVL3 (GOWN DISPOSABLE) ×1 IMPLANT
IRRIGATION SUCT STRKRFLW 2 WTP (MISCELLANEOUS) ×1 IMPLANT
KIT BASIN OR (CUSTOM PROCEDURE TRAY) ×1 IMPLANT
KIT IMAGING PINPOINTPAQ (MISCELLANEOUS) IMPLANT
KIT TURNOVER KIT B (KITS) ×1 IMPLANT
NS IRRIG 1000ML POUR BTL (IV SOLUTION) ×1 IMPLANT
PAD ARMBOARD POSITIONER FOAM (MISCELLANEOUS) ×1 IMPLANT
PENCIL BUTTON HOLSTER BLD 10FT (ELECTRODE) ×1 IMPLANT
SCISSORS LAP 5X35 DISP (ENDOMECHANICALS) ×1 IMPLANT
SET TUBE SMOKE EVAC HIGH FLOW (TUBING) ×1 IMPLANT
SUT MNCRL AB 4-0 PS2 18 (SUTURE) ×1 IMPLANT
SUT VICRYL 0 UR6 27IN ABS (SUTURE) IMPLANT
SYSTEM BAG RETRIEVAL 10MM (BASKET) IMPLANT
TOWEL GREEN STERILE FF (TOWEL DISPOSABLE) ×1 IMPLANT
TRAY LAPAROSCOPIC MC (CUSTOM PROCEDURE TRAY) ×1 IMPLANT
TROCAR ADV FIXATION 5X100MM (TROCAR) ×3 IMPLANT
TROCAR BALLN 12MMX100 BLUNT (TROCAR) ×1 IMPLANT
WARMER LAPAROSCOPE (MISCELLANEOUS) ×1 IMPLANT
WATER STERILE IRR 1000ML POUR (IV SOLUTION) ×1 IMPLANT

## 2024-05-04 NOTE — Anesthesia Procedure Notes (Signed)
 Procedure Name: Intubation Date/Time: 05/04/2024 9:18 AM  Performed by: Alys Julian, CRNAPre-anesthesia Checklist: Patient identified, Emergency Drugs available, Suction available and Patient being monitored Patient Re-evaluated:Patient Re-evaluated prior to induction Oxygen Delivery Method: Circle system utilized Preoxygenation: Pre-oxygenation with 100% oxygen Induction Type: IV induction Ventilation: Mask ventilation without difficulty Laryngoscope Size: Miller and 2 Grade View: Grade I Tube type: Oral Tube size: 7.0 mm Number of attempts: 1 Airway Equipment and Method: Stylet and Bite block Placement Confirmation: ETT inserted through vocal cords under direct vision, positive ETCO2 and breath sounds checked- equal and bilateral Secured at: 21 cm Tube secured with: Tape Dental Injury: Teeth and Oropharynx as per pre-operative assessment

## 2024-05-04 NOTE — Progress Notes (Signed)
 Subjective No acute events. Still with some RUQ pain although much improved. No n/v  Objective: Vital signs in last 24 hours: Temp:  [98.1 F (36.7 C)-99.1 F (37.3 C)] 98.4 F (36.9 C) (05/29 0804) Pulse Rate:  [53-59] 59 (05/29 0804) Resp:  [14-18] 18 (05/29 0804) BP: (97-118)/(53-83) 98/53 (05/29 0424) SpO2:  [99 %-100 %] 100 % (05/29 0804) Weight:  [62.1 kg] 62.1 kg (05/29 0804) Last BM Date : 05/03/24  Intake/Output from previous day: 05/28 0701 - 05/29 0700 In: 56.1 [I.V.:56.1] Out: -  Intake/Output this shift: No intake/output data recorded.  Gen: NAD, comfortable CV: RRR Pulm: Normal work of breathing Abd: Soft, mild RUQ tenderness Ext: SCDs in place  Lab Results: CBC  Recent Labs    05/03/24 0510 05/04/24 0707  WBC 6.3 5.2  HGB 14.1 11.7*  HCT 42.6 34.6*  PLT 298 231   BMET Recent Labs    05/03/24 0510 05/04/24 0707  NA 138 137  K 4.2 3.6  CL 106 108  CO2 25 25  GLUCOSE 115* 86  BUN 7 9  CREATININE 0.70 0.66  CALCIUM 9.1 8.6*   PT/INR No results for input(s): "LABPROT", "INR" in the last 72 hours. ABG No results for input(s): "PHART", "HCO3" in the last 72 hours.  Invalid input(s): "PCO2", "PO2"  Studies/Results:  Anti-infectives: Anti-infectives (From admission, onward)    Start     Dose/Rate Route Frequency Ordered Stop   05/04/24 0800  [MAR Hold]  cefTRIAXone  (ROCEPHIN ) 2 g in sodium chloride  0.9 % 100 mL IVPB        (MAR Hold since Thu 05/04/2024 at 0755.Hold Reason: Transfer to a Procedural area)   2 g 200 mL/hr over 30 Minutes Intravenous Every 24 hours 05/03/24 1459 05/10/24 0759   05/03/24 0800  cefTRIAXone  (ROCEPHIN ) 1 g in sodium chloride  0.9 % 100 mL IVPB        1 g 200 mL/hr over 30 Minutes Intravenous  Once 05/03/24 0753 05/03/24 1610        Assessment/Plan: Patient Active Problem List   Diagnosis Date Noted   Acute cholecystitis 05/03/2024   Abdominal pain 01/19/2019   Vitamin D  insufficiency 12/17/2016    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 The pathophysiology of gallbladder disease was then reviewed as well. -The options for treatment were discussed including ongoing observation which may result in subsequent gallbladder complications (infection, pancreatitis, choledocholithiasis, etc), drainage procedures, and surgery - laparoscopic cholecystectomy -The planned procedure, material risks (including, but not limited to, pain, bleeding, infection, scarring, need for blood transfusion, damage to surrounding structures- blood vessels/nerves/viscus/organs, damage to bile duct, bile leak, chronic diarrhea, conversion to a 'subtotal' cholecystectomy and general expectations therein, post-cholecystectomy diarrhea, potential need for additional procedures including EGD/ERCP, hernia, worsening of pre-existing medical conditions, pancreatitis, pneumonia, heart attack, stroke, death) benefits and alternatives to surgery were discussed at length. We have noted a good probability that the procedure would help improve their symptoms. The patient's questions were answered to her satisfaction, she voiced understanding and elected to proceed with surgery. Additionally, we discussed typical postoperative expectations and the recovery process.   LOS: 0 days   I spent a total of 35 minutes in both face-to-face and non-face-to-face activities, excluding procedures performed, for this visit on the date of this encounter.  Beatris Lincoln, MD [facility], A [facility]

## 2024-05-04 NOTE — Anesthesia Postprocedure Evaluation (Signed)
 Anesthesia Post Note  Patient: Maureen Wolf  Procedure(s) Performed: LAPAROSCOPIC CHOLECYSTECTOMY INDOCYANINE GREEN FLUORESCENCE IMAGING (ICG)     Patient location during evaluation: PACU Anesthesia Type: General Level of consciousness: awake and alert and oriented Pain management: pain level controlled Vital Signs Assessment: post-procedure vital signs reviewed and stable Respiratory status: spontaneous breathing, nonlabored ventilation and respiratory function stable Cardiovascular status: blood pressure returned to baseline and stable Postop Assessment: no apparent nausea or vomiting Anesthetic complications: no   No notable events documented.  Last Vitals:  Vitals:   05/04/24 1110 05/04/24 1147  BP: 111/72 108/72  Pulse: (!) 55 (!) 50  Resp: 10 16  Temp:    SpO2: 98% 100%    Last Pain:  Vitals:   05/04/24 1247  TempSrc:   PainSc: 1                  Raylei Losurdo A.

## 2024-05-04 NOTE — Progress Notes (Signed)
 Patient ate lunch without complaints. Patient voided. Patient reported controlled pain with tylenol  with a rate of 4/10 and declined further medication. IV was removed per order. Discussed AVS instructions and patient offered the chance to ask questions with no further questions or complaints.

## 2024-05-04 NOTE — Transfer of Care (Signed)
 Immediate Anesthesia Transfer of Care Note  Patient: Maureen Wolf  Procedure(s) Performed: LAPAROSCOPIC CHOLECYSTECTOMY INDOCYANINE GREEN FLUORESCENCE IMAGING (ICG)  Patient Location: PACU  Anesthesia Type:General  Level of Consciousness: awake, alert , and oriented  Airway & Oxygen Therapy: Patient Spontanous Breathing and Patient connected to nasal cannula oxygen  Post-op Assessment: Report given to RN, Post -op Vital signs reviewed and stable, Patient moving all extremities X 4, and Patient able to stick tongue midline  Post vital signs: Reviewed and stable  Last Vitals:  Vitals Value Taken Time  BP 128/68   Temp 97.7   Pulse 62 05/04/24 1039  Resp 12   SpO2 100 % 05/04/24 1039  Vitals shown include unfiled device data.  Last Pain:  Vitals:   05/04/24 0804  TempSrc: Oral  PainSc:       Patients Stated Pain Goal: 0 (05/03/24 1605)  Complications: No notable events documented.

## 2024-05-04 NOTE — Discharge Summary (Signed)
    Patient ID: Maureen Wolf [id number] 2006-10-14 18 y.o.  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  She states she was eating twice and day and has dropped to once a day as eating can make her stomach hurt from time to time.  She hasn't noticed any one specific type of food.  She has been awoken from sleep a couple times with pain that tends to go away.  Last night she awoke around 0200 with pain in her epigastrium.  She then developed nausea and one episode of vomiting.  She denies fevers or diarrhea.  She has had no further vomiting, but mild nausea.     She presented to the ED for evaluation.  Her pain seems to have subsided at this time.  Labs are unremarkable, but she has a CT scan concerning for cholecystitis as well as an US  with gb wall of 7.6mm and large gallstones.  We have been asked to see her for further evaluation and recommendations.  Procedures Lap chole with ICG dye, Dr. Camilo Cella 05/04/24  Hospital Course:  The patient was admitted and underwent a laparoscopic cholecystectomy with ICG.  The patient tolerated the procedure well.  On POD 0, the patient was tolerating a regular diet, voiding well, mobilizing, and pain was controlled with oral pain medications.  The patient was stable for DC home at this time with appropriate follow up made.   Allergies as of 05/04/2024   No Known Allergies      Medication List     TAKE these medications    acetaminophen  500 MG tablet Commonly known as:  TYLENOL  Take 2 tablets (1,000 mg total) by mouth every 6 (six) hours as needed.   ibuprofen  600 MG tablet Commonly known as: ADVIL  Take 1 tablet (600 mg total) by mouth every 8 (eight) hours as needed.   traMADol  50 MG tablet Commonly known as: ULTRAM  Take 1 tablet (50 mg total) by mouth every 6 (six) hours as needed (pain not controlled with ibuprofen  and tylenol ).          Follow-up Information     Maczis, Puja Gosai, PA-C. Go on 06/01/2024.   Specialty: General Surgery Why: 10 AM, please arrive 30 min prior to appointment time to check in. Contact information: [address] [place] [address] [facility] [place] [address] [phone]                 Signed: Marlin Simmonds, [facility] 05/04/2024, 12:06 PM Please see Amion for pager number during day hours 7:00am-4:30pm, 7-11:30am on Weekends

## 2024-05-04 NOTE — Op Note (Signed)
 05/04/2024 10:29 AM  PATIENT: Maureen Wolf  18 y.o. female  Patient Care Team: Pcp, No as PCP - General Calvert Caul, MD as Consulting Physician (Pediatric Gastroenterology) Ovidio Blower, MD as Consulting Physician (Pediatric Endocrinology)  PRE-OPERATIVE DIAGNOSIS: Acute cholecystitis  POST-OPERATIVE DIAGNOSIS: Same  PROCEDURE: Laparoscopic cholecystectomy with ICG cholangiography  SURGEON: Beatris Lincoln, MD  ANESTHESIA: General endotracheal  EBL: 10 mL  DRAINS: None  SPECIMEN: Gallbladder  COUNTS: Sponge, needle and instrument counts were reported correct x2 at the conclusion of the operation  DISPOSITION: PACU in satisfactory condition  COMPLICATIONS: None  FINDINGS: Moderately inflamed edematous gallbladder.  ICG cholangiography demonstrates uptake by the liver and excretion of the biliary system with filling of the cystic duct.  No significant filling of the gallbladder.  There is a stone impacted in the neck.  Critical view of safety was achieved prior to clipping or dividing any structures  DESCRIPTION:   The patient was identified & brought into the operating room.  She was then positioned supine on the OR table. SCDs were in place and active during the entire case.  She then underwent general endotracheal anesthesia. Pressure points were padded. Hair on the abdomen was clipped by the OR team. The abdomen was prepped and draped in the standard sterile fashion. Antibiotics were administered. A surgical timeout was performed and confirmed our plan.   A periumbilical incision was made. The umbilical stalk was grasped and retracted outwardly. The infraumbilical fascia was identified and incised. The peritoneal cavity was gently entered bluntly. A purse-string 0 Vicryl suture was placed. The Hasson cannula was inserted into the peritoneal cavity and insufflation with CO2 commenced to . A laparoscope was inserted into the peritoneal cavity and inspection  confirmed no evidence of trocar site complications. The patient was then positioned in reverse Trendelenburg with slight left side down. 3 additional 5mm trocars were placed along the right subcostal line - one 5mm port in mid subcostal region, another 5mm port in the right flank near the anterior axillary line, and a third 5mm port in the left subxiphoid region obliquely near the falciform ligament.  The liver and gallbladder were inspected.  The gallbladder is edematous and inflamed in appearance.  These are all consistent with acute cholecystitis.  We were able to aspirated to facilitate retracting the gallbladder.  There was turbid green bile within the gallbladder. The gallbladder fundus was grasped and elevated cephalad. An additional grasper was then placed on the infundibulum of the gallbladder and the infundibulum was retracted laterally. Staying high on the gallbladder, the peritoneum on both sides of the gallbladder was opened with hook cautery. Gentle blunt dissection was then employed with a [place]  dissector working down into [employer]. The cystic duct was identified and carefully circumferentially dissected. The cystic artery was also identified and carefully circumferentially dissected. The space between the cystic artery and hepatocystic plate was developed such that a good view of the liver could be seen through a window medial to the cystic artery. The triangle of Calot had been cleared of all fibrofatty tissue. At this point, a critical view of safety was achieved and the only structures visualized was the skeletonized cystic duct laterally, the skeletonized cystic artery and the liver through the window medial to the artery.  A diminutive posterior cystic artery was noted.  Additionally, there is a stone that appears somewhat impacted the neck but we are able to milked this back.  Under near-infrared light, ICG cholangiography demonstrates uptake by the liver and excretion  into the  biliary system.  There is opacification of the tracer seen in the porta hepatis extending down with a sidebranch coming off to the gallbladder which is our candidate cystic duct.  There is also no activity seen within our candidate cystic artery or the diminutive posterior branch.  There is tracer seen through the wall of the duodenum consistent with a patent biliary system.  The cystic duct and artery were clipped with 2 clips on the patient side and 1 clip on the specimen side. The cystic duct and artery were then divided. The gallbladder was then freed from its remaining attachments to the liver using electrocautery and placed into an endocatch bag. The RUQ was gently irrigated with sterile saline. Hemostasis was then verified. The clips were in good position; the gallbladder fossa was dry. The rest of the abdomen was inspected no injury nor bleeding elsewhere was identified.  The endocatch bag containing the gallbladder was then removed from the umbilical port site and passed off as specimen. The RUQ ports were removed under direct visualization and noted to be hemostatic. The umbilical fascia was then closed using the 0 Vicryl purse-string suture. The fascia was palpated and noted to be completely closed. The skin of all incision sites was approximated with 4-0 monocryl subcuticular suture and dermabond applied. She was then awakened from anesthesia, extubated, and transferred to a stretcher for transport to PACU in satisfactory condition.

## 2024-05-05 ENCOUNTER — Encounter (HOSPITAL_COMMUNITY): Payer: Self-pay | Admitting: Surgery

## 2024-05-05 LAB — SURGICAL PATHOLOGY
